# Patient Record
Sex: Female | Born: 1990 | Race: Black or African American | Hispanic: No | Marital: Single | Smoking: Never smoker
Health system: Southern US, Community
[De-identification: ages and names within clinical notes are randomized; demographics above are authoritative.]

---

## 2009-05-05 ENCOUNTER — Emergency Department (HOSPITAL_COMMUNITY): Admission: EM | Admit: 2009-05-05 | Discharge: 2009-05-05 | Payer: Self-pay | Admitting: Emergency Medicine

## 2009-11-25 ENCOUNTER — Emergency Department (HOSPITAL_COMMUNITY)
Admission: EM | Admit: 2009-11-25 | Discharge: 2009-11-25 | Payer: Self-pay | Source: Home / Self Care | Admitting: Emergency Medicine

## 2009-11-29 ENCOUNTER — Emergency Department (HOSPITAL_COMMUNITY): Admission: EM | Admit: 2009-11-29 | Discharge: 2009-11-29 | Payer: Self-pay | Admitting: Family Medicine

## 2009-12-02 ENCOUNTER — Emergency Department (HOSPITAL_COMMUNITY): Admission: EM | Admit: 2009-12-02 | Discharge: 2009-12-02 | Payer: Self-pay | Admitting: Family Medicine

## 2010-01-16 ENCOUNTER — Emergency Department (HOSPITAL_COMMUNITY)
Admission: EM | Admit: 2010-01-16 | Discharge: 2010-01-16 | Payer: Self-pay | Source: Home / Self Care | Admitting: Emergency Medicine

## 2010-11-07 ENCOUNTER — Emergency Department (HOSPITAL_COMMUNITY)
Admission: EM | Admit: 2010-11-07 | Discharge: 2010-11-07 | Disposition: A | Discharge status: Home / Self Care | Payer: PRIVATE HEALTH INSURANCE | Attending: Emergency Medicine | Admitting: Emergency Medicine

## 2010-11-07 DIAGNOSIS — L500 Allergic urticaria: Secondary | ICD-10-CM | POA: Insufficient documentation

## 2010-11-07 DIAGNOSIS — J45909 Unspecified asthma, uncomplicated: Secondary | ICD-10-CM | POA: Insufficient documentation

## 2010-11-29 ENCOUNTER — Emergency Department (INDEPENDENT_AMBULATORY_CARE_PROVIDER_SITE_OTHER)
Admission: EM | Admit: 2010-11-29 | Discharge: 2010-11-29 | Disposition: A | Discharge status: Home / Self Care | Payer: PRIVATE HEALTH INSURANCE | Source: Home / Self Care | Attending: Family Medicine | Admitting: Family Medicine

## 2010-11-29 DIAGNOSIS — Z0000 Encounter for general adult medical examination without abnormal findings: Secondary | ICD-10-CM

## 2010-11-29 LAB — WET PREP, GENITAL: Yeast Wet Prep HPF POC: NONE SEEN

## 2010-11-29 LAB — POCT URINALYSIS DIP (DEVICE)
Bilirubin Urine: NEGATIVE
Hgb urine dipstick: NEGATIVE
Nitrite: NEGATIVE
Protein, ur: NEGATIVE mg/dL
pH: 7 (ref 5.0–8.0)

## 2010-11-29 NOTE — ED Notes (Signed)
Requests STD testing.  Denies any sx

## 2010-11-29 NOTE — ED Provider Notes (Signed)
History     CSN: [id number] Arrival date & time: 11/29/2010  5:18 PM   First MD Initiated Contact with Patient 11/29/10 1644      No chief complaint on file.   (Consider location/radiation/quality/duration/timing/severity/associated sxs/prior treatment) HPI Comments: rayden presents for sti testing. She denies any discharge or symptoms. She is sexually active with one partner. They use condoms consistently.   Patient is a 20 y.o. female presenting with female genitourinary complaint.  Female GU Problem Primary symptoms include no discharge, no pelvic pain, no genital lesions, no genital pain, no genital rash, no genital itching, no genital odor, no dysuria and no vaginal bleeding. She has missed her period. Her LMP was months ago. Sexual activity: sexually active. There is no concern regarding sexually transmitted diseases. She uses condoms for contraception.    No past medical history on file.  No past surgical history on file.  No family history on file.  History  Substance Use Topics  . Smoking status: Not on file  . Smokeless tobacco: Not on file  . Alcohol Use: Not on file    OB History    No data available      Review of Systems  Constitutional: Negative.   HENT: Negative.   Eyes: Negative.   Respiratory: Negative.   Gastrointestinal: Negative.   Genitourinary: Negative for dysuria, vaginal bleeding, vaginal discharge, vaginal pain and pelvic pain.    Allergies  Review of patient's allergies indicates not on file.  Home Medications  No current outpatient prescriptions on file.  There were no vitals taken for this visit.  Physical Exam  Constitutional: She appears well-developed and well-nourished.  HENT:  Head: Normocephalic and atraumatic.  Eyes: EOM are normal.  Genitourinary: Vagina normal. Cervix exhibits no discharge and no friability.    ED Course  Procedures (including critical care time)  Labs Reviewed - No data to display No results  found.   No diagnosis found.    MDM  Labs pending; will call with any abnormal results.        Richardo Priest, MD 11/29/10 1754

## 2010-11-30 LAB — GC/CHLAMYDIA PROBE AMP, GENITAL: Chlamydia, DNA Probe: NEGATIVE

## 2012-02-11 ENCOUNTER — Encounter (HOSPITAL_COMMUNITY): Payer: Self-pay | Admitting: Emergency Medicine

## 2012-02-11 ENCOUNTER — Emergency Department (INDEPENDENT_AMBULATORY_CARE_PROVIDER_SITE_OTHER)
Admission: EM | Admit: 2012-02-11 | Discharge: 2012-02-11 | Disposition: A | Discharge status: Home / Self Care | Payer: BC Managed Care – PPO | Source: Home / Self Care | Attending: Family Medicine | Admitting: Family Medicine

## 2012-02-11 DIAGNOSIS — J111 Influenza due to unidentified influenza virus with other respiratory manifestations: Secondary | ICD-10-CM

## 2012-02-11 DIAGNOSIS — J45901 Unspecified asthma with (acute) exacerbation: Secondary | ICD-10-CM

## 2012-02-11 MED ORDER — AZITHROMYCIN 250 MG PO TABS: ORAL_TABLET | ORAL | Status: AC

## 2012-02-11 MED ORDER — DEXTROMETHORPHAN POLISTIREX 30 MG/5ML PO LQCR: 60.0000 mg | Freq: Two times a day (BID) | ORAL | Status: AC

## 2012-02-11 MED ORDER — ALBUTEROL SULFATE (5 MG/ML) 0.5% IN NEBU
INHALATION_SOLUTION | RESPIRATORY_TRACT | Status: AC
  Filled 2012-02-11: qty 1

## 2012-02-11 MED ORDER — IPRATROPIUM BROMIDE 0.02 % IN SOLN
0.5000 mg | Freq: Once | RESPIRATORY_TRACT | Status: AC
  Administered 2012-02-11: 0.5 mg via RESPIRATORY_TRACT

## 2012-02-11 MED ORDER — ALBUTEROL SULFATE HFA 108 (90 BASE) MCG/ACT IN AERS: 1.0000 | INHALATION_SPRAY | Freq: Four times a day (QID) | RESPIRATORY_TRACT | Status: AC | PRN

## 2012-02-11 MED ORDER — ALBUTEROL SULFATE (5 MG/ML) 0.5% IN NEBU
5.0000 mg | INHALATION_SOLUTION | Freq: Once | RESPIRATORY_TRACT | Status: AC
  Administered 2012-02-11: 5 mg via RESPIRATORY_TRACT

## 2012-02-11 NOTE — ED Notes (Signed)
Continues to have headache

## 2012-02-11 NOTE — ED Notes (Signed)
Reports "flu like " symptoms.  Onset last night.  Has taken tylenol.  - statements per information sheet.

## 2012-02-11 NOTE — ED Provider Notes (Signed)
History     CSN: [id number]  Arrival date & time 02/11/12  1537   First MD Initiated Contact with Patient 02/11/12 1539      Chief Complaint  Patient presents with  . URI    (Consider location/radiation/quality/duration/timing/severity/associated sxs/prior treatment) Patient is a 22 y.o. female presenting with cough. The history is provided by the patient.  Cough This is a new problem. The current episode started yesterday. The problem has been gradually worsening. The cough is non-productive. The maximum temperature recorded prior to her arrival was 100 to 100.9 F. Associated symptoms include chills, rhinorrhea, myalgias, shortness of breath and wheezing. Pertinent negatives include no sore throat. She is not a smoker.    Past Medical History  Diagnosis Date  . Asthma     History reviewed. No pertinent past surgical history.  No family history on file.  History  Substance Use Topics  . Smoking status: Never Smoker   . Smokeless tobacco: Not on file  . Alcohol Use: Yes     Comment: occasional    OB History    Grav Para Term Preterm Abortions TAB SAB Ect Mult Living                  Review of Systems  Constitutional: Positive for fever, chills and appetite change.  HENT: Positive for rhinorrhea. Negative for sore throat.   Respiratory: Positive for cough, shortness of breath and wheezing.   Gastrointestinal: Positive for nausea. Negative for vomiting and diarrhea.  Musculoskeletal: Positive for myalgias.    Allergies  Penicillins  Home Medications   Current Outpatient Rx  Name  Route  Sig  Dispense  Refill  . ALBUTEROL SULFATE HFA 108 (90 BASE) MCG/ACT IN AERS   Inhalation   Inhale 1-2 puffs into the lungs every 6 (six) hours as needed for wheezing.   1 Inhaler   0   . ALBUTEROL IN   Inhalation   Inhale into the lungs as needed.           . AZITHROMYCIN 250 MG PO TABS      Take as directed on pack   6 each   0   . SYMBICORT IN   Inhalation   Inhale 1 puff into the lungs 2 (two) times daily.           Marland Kitchen DEXTROMETHORPHAN POLISTIREX ER 30 MG/5ML PO LQCR   Oral   Take 10 mLs (60 mg total) by mouth 2 (two) times daily. For cough, prn   89 mL   0     BP 112/74  Pulse 70  Temp 98.1 F (36.7 C) (Oral)  Resp 20  Ht 5\' 3"  (1.6 m)  Wt 275 lb (124.739 kg)  BMI 48.71 kg/m2  SpO2 100%  LMP 01/21/2012  Physical Exam  Nursing note and vitals reviewed. Constitutional: She is oriented to person, place, and time. She appears well-developed and well-nourished. No distress.  HENT:  Head: Normocephalic.  Right Ear: External ear normal.  Left Ear: External ear normal.  Mouth/Throat: Oropharynx is clear and moist.  Eyes: Pupils are equal, round, and reactive to light.  Neck: Normal range of motion. Neck supple.  Cardiovascular: Normal rate, regular rhythm, normal heart sounds and intact distal pulses.   Pulmonary/Chest: She has wheezes.  Abdominal: Soft. Bowel sounds are normal. There is no tenderness.  Lymphadenopathy:    She has no cervical adenopathy.  Neurological: She is alert and oriented to person, place, and time.  Skin: Skin  is warm and dry.    ED Course  Procedures (including critical care time)  Labs Reviewed - No data to display No results found.   1. Influenza-like illness   2. Asthma flare       MDM  Lings clear after neb and sx improved.         Linna Hoff, MD 02/11/12 1700

## 2012-06-20 IMAGING — CR DG FOREARM 2V*R*
2 series · 2 of 2 positions shown · non-contrast
Comparison: None.

CLINICAL DATA: Laceration to forearm with glass.

RIGHT FOREARM - 2 VIEW

[x forearm ap right]
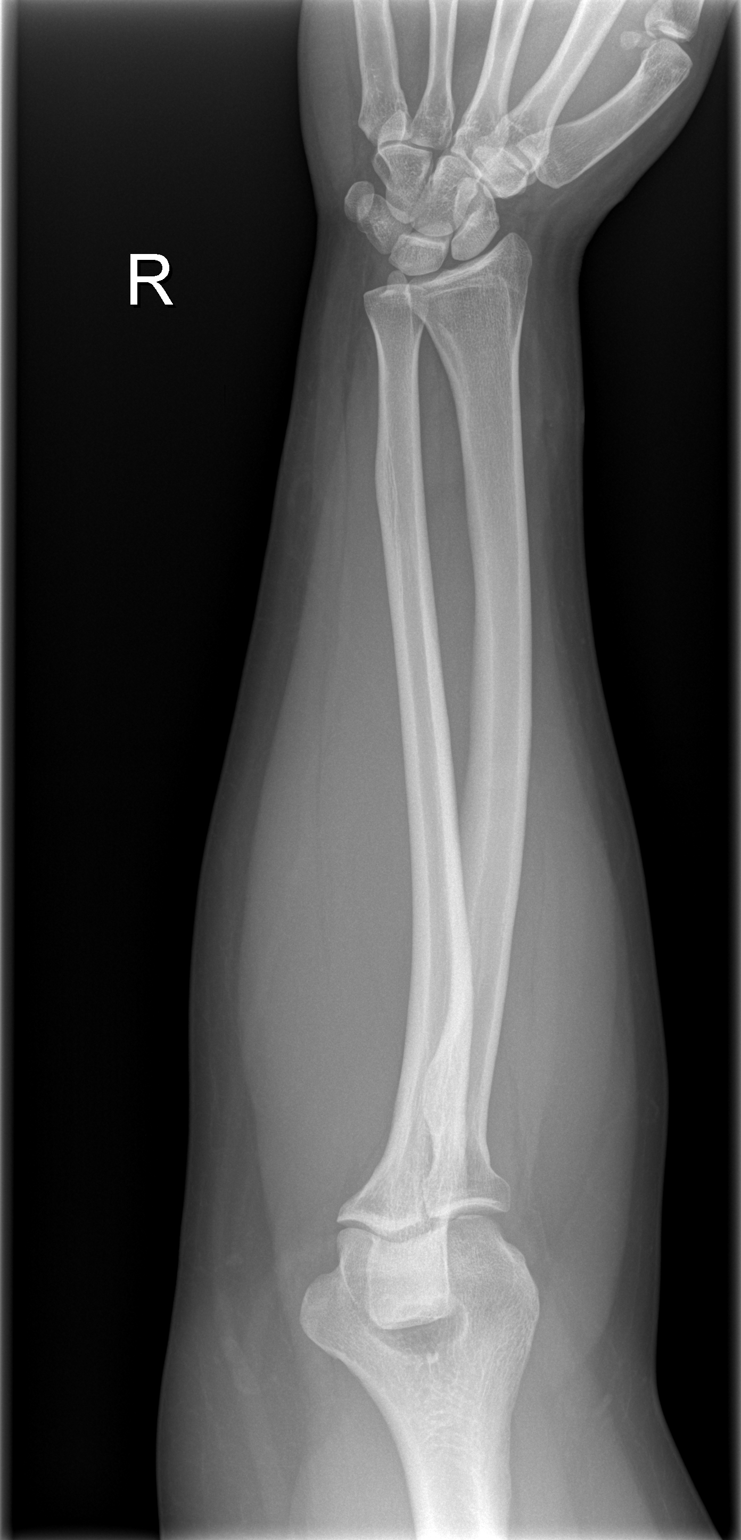

[x forearm lat right]
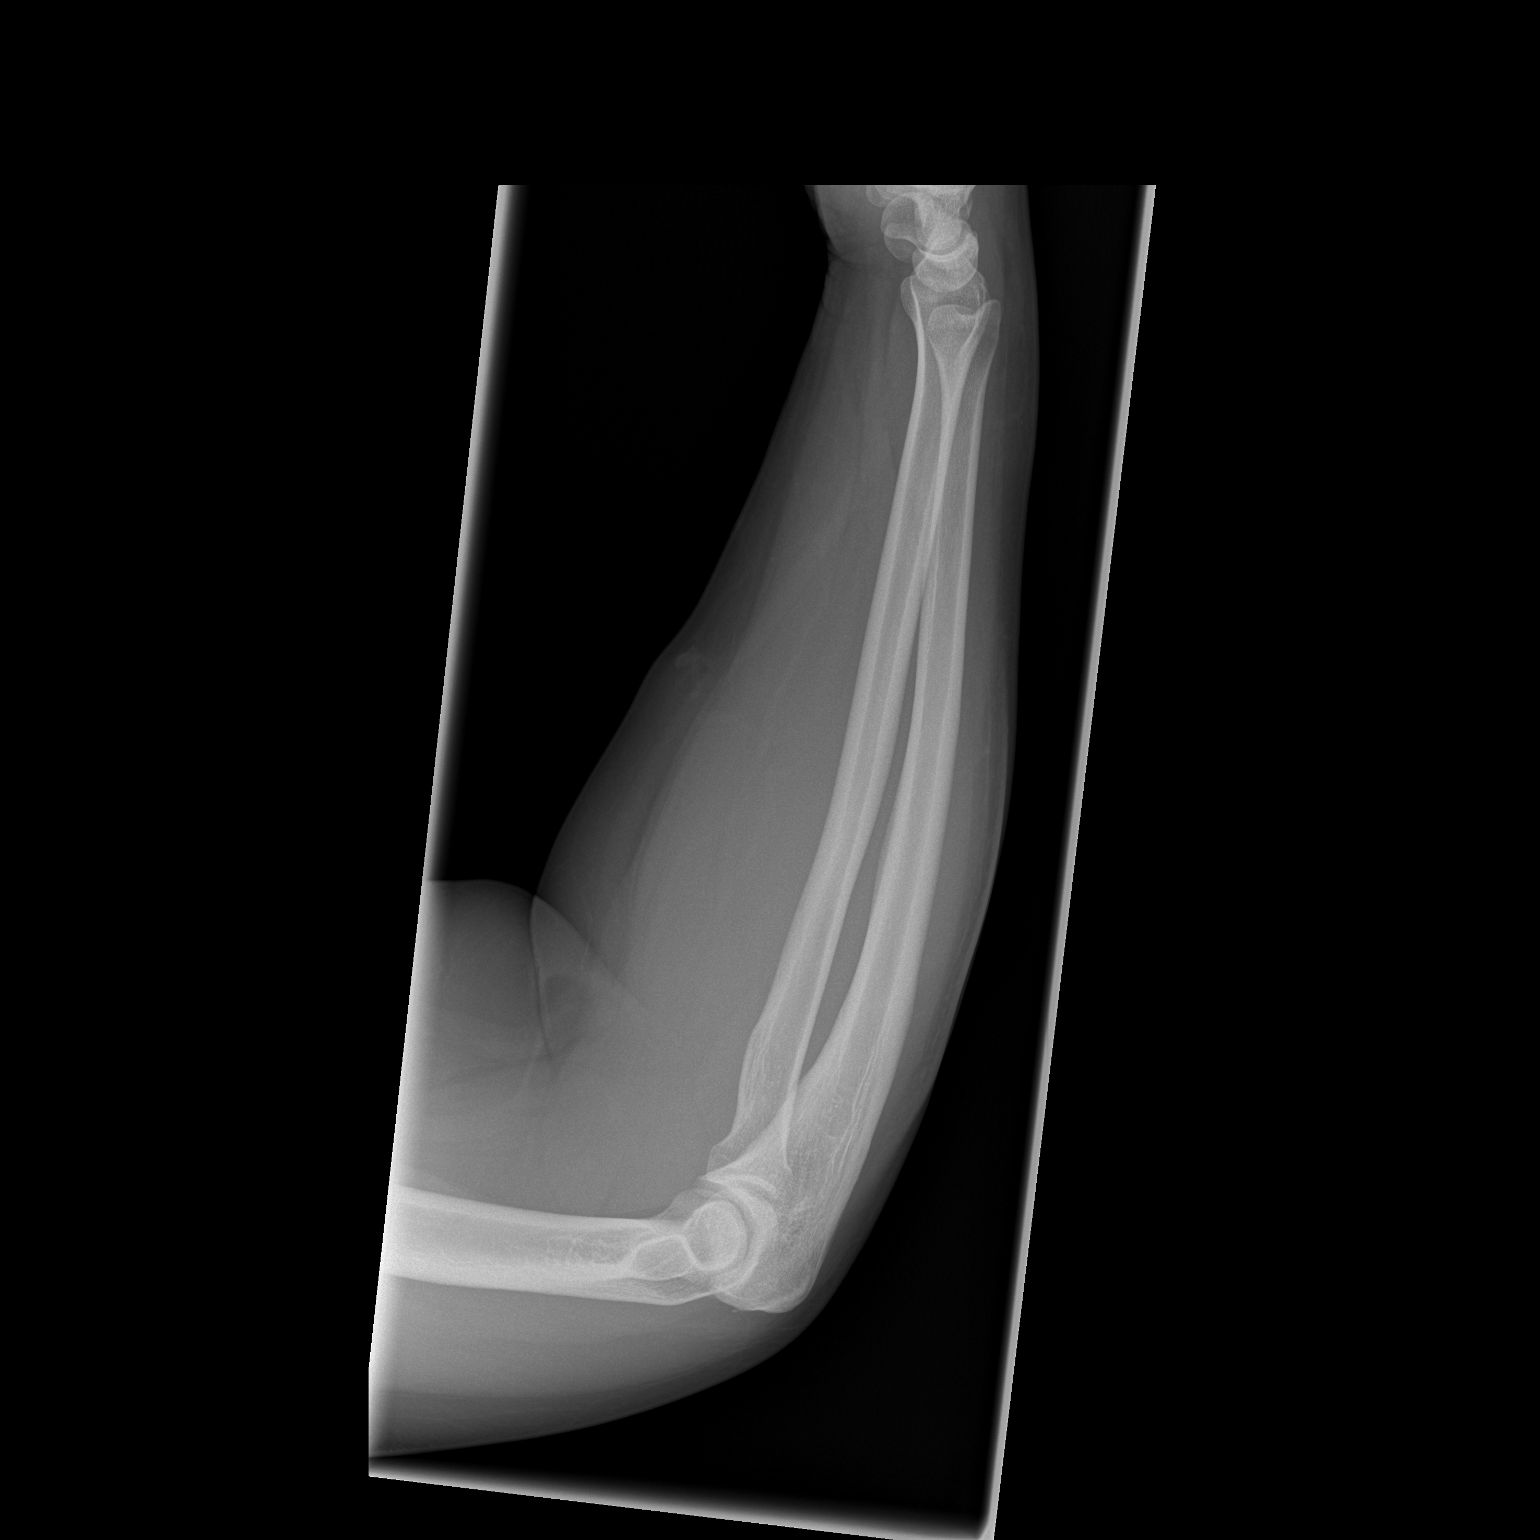

[2 of 2 positions shown; findings below may reference images not displayed]

FINDINGS: Laceration along the anterior mid forearm noted.

No fracture, foreign body, or acute bony findings are identified.
IMPRESSION: 1.  Anterior laceration without foreign body identified.

## 2013-07-15 ENCOUNTER — Emergency Department (HOSPITAL_COMMUNITY)
Admission: EM | Admit: 2013-07-15 | Discharge: 2013-07-15 | Disposition: A | Discharge status: Home / Self Care | Payer: BC Managed Care – PPO | Source: Home / Self Care | Attending: Family Medicine | Admitting: Family Medicine

## 2013-07-15 ENCOUNTER — Encounter (HOSPITAL_COMMUNITY): Payer: Self-pay | Admitting: Emergency Medicine

## 2013-07-15 DIAGNOSIS — J45909 Unspecified asthma, uncomplicated: Secondary | ICD-10-CM

## 2013-07-15 MED ORDER — ALBUTEROL SULFATE HFA 108 (90 BASE) MCG/ACT IN AERS: 2.0000 | INHALATION_SPRAY | Freq: Four times a day (QID) | RESPIRATORY_TRACT | Status: AC | PRN

## 2013-07-15 MED ORDER — BUDESONIDE-FORMOTEROL FUMARATE 80-4.5 MCG/ACT IN AERO: 1.0000 | INHALATION_SPRAY | Freq: Two times a day (BID) | RESPIRATORY_TRACT | Status: AC

## 2013-07-15 MED ORDER — CETIRIZINE HCL 10 MG PO TABS: 10.0000 mg | ORAL_TABLET | Freq: Every day | ORAL | Status: AC

## 2013-07-15 NOTE — ED Notes (Signed)
For  Medication  Refill

## 2013-07-15 NOTE — ED Provider Notes (Signed)
CSN: [id number]     Arrival date & time 07/15/13  1426 History   First MD Initiated Contact with Patient 07/15/13 1442     Chief Complaint  Patient presents with  . Medication Refill   (Consider location/radiation/quality/duration/timing/severity/associated sxs/prior Treatment) Patient is a 23 y.o. female presenting with shortness of breath. The history is provided by the patient.  Shortness of Breath Severity:  Mild Chronicity:  Chronic Context comment:  Currently fine but out of meds for sev weeks. Associated symptoms: wheezing     Past Medical History  Diagnosis Date  . Asthma    No past surgical history on file. No family history on file. History  Substance Use Topics  . Smoking status: Never Smoker   . Smokeless tobacco: Not on file  . Alcohol Use: Yes     Comment: occasional   OB History   Grav Para Term Preterm Abortions TAB SAB Ect Mult Living                 Review of Systems  Constitutional: Negative.   Respiratory: Positive for shortness of breath and wheezing.     Allergies  Penicillins  Home Medications   Prior to Admission medications   Medication Sig Start Date End Date Taking? Authorizing Provider  albuterol (PROVENTIL HFA;VENTOLIN HFA) 108 (90 BASE) MCG/ACT inhaler Inhale 1-2 puffs into the lungs every 6 (six) hours as needed for wheezing. 02/11/12   Linna HoffJames D Kindl, MD  albuterol (PROVENTIL HFA;VENTOLIN HFA) 108 (90 BASE) MCG/ACT inhaler Inhale 2 puffs into the lungs every 6 (six) hours as needed for wheezing or shortness of breath. 07/15/13   Linna HoffJames D Kindl, MD  ALBUTEROL IN Inhale into the lungs as needed.      Historical Provider, MD  azithromycin (ZITHROMAX Z-PAK) 250 MG tablet Take as directed on pack 02/11/12   Linna HoffJames D Kindl, MD  budesonide-formoterol (SYMBICORT) 80-4.5 MCG/ACT inhaler Inhale 1 puff into the lungs 2 (two) times daily. 07/15/13   Linna HoffJames D Kindl, MD  Budesonide-Formoterol Fumarate (SYMBICORT IN) Inhale 1 puff into the lungs 2 (two) times  daily.      Historical Provider, MD  cetirizine (ZYRTEC) 10 MG tablet Take 1 tablet (10 mg total) by mouth daily. One tab daily for allergies 07/15/13   Linna HoffJames D Kindl, MD  dextromethorphan (DELSYM) 30 MG/5ML liquid Take 10 mLs (60 mg total) by mouth 2 (two) times daily. For cough, prn 02/11/12   Linna HoffJames D Kindl, MD   BP 110/62  Pulse 77  Temp(Src) 97.8 F (36.6 C) (Oral)  Resp 14  SpO2 99% Physical Exam  Nursing note and vitals reviewed. Constitutional: She is oriented to person, place, and time. She appears well-developed and well-nourished. No distress.  Neck: Normal range of motion. Neck supple.  Pulmonary/Chest: Effort normal and breath sounds normal. She has no wheezes.  Neurological: She is alert and oriented to person, place, and time.  Skin: Skin is warm and dry.    ED Course  Procedures (including critical care time) Labs Review Labs Reviewed - No data to display  Imaging Review No results found.   MDM   1. Asthma due to seasonal allergies        Linna HoffJames D Kindl, MD 07/15/13 1505

## 2013-07-15 NOTE — Discharge Instructions (Signed)
See your doctor as needed
